# Patient Record
Sex: Female | Born: 1945 | Race: White | Hispanic: No | Marital: Married | Smoking: Former smoker
Health system: Southern US, Community
[De-identification: ages and names within clinical notes are randomized; demographics above are authoritative.]

## PROBLEM LIST (undated history)

## (undated) DIAGNOSIS — K219 Gastro-esophageal reflux disease without esophagitis: Secondary | ICD-10-CM

## (undated) DIAGNOSIS — E7800 Pure hypercholesterolemia, unspecified: Secondary | ICD-10-CM

## (undated) DIAGNOSIS — I10 Essential (primary) hypertension: Secondary | ICD-10-CM

## (undated) HISTORY — PX: ABDOMINAL HYSTERECTOMY: SHX81

## (undated) HISTORY — PX: BREAST SURGERY: SHX581

---

## 2016-11-24 ENCOUNTER — Emergency Department (HOSPITAL_BASED_OUTPATIENT_CLINIC_OR_DEPARTMENT_OTHER): Payer: Medicare Other

## 2016-11-24 ENCOUNTER — Encounter (HOSPITAL_BASED_OUTPATIENT_CLINIC_OR_DEPARTMENT_OTHER): Payer: Self-pay

## 2016-11-24 ENCOUNTER — Emergency Department (HOSPITAL_BASED_OUTPATIENT_CLINIC_OR_DEPARTMENT_OTHER)
Admission: EM | Admit: 2016-11-24 | Discharge: 2016-11-24 | Disposition: A | Discharge status: Home / Self Care | Payer: Medicare Other | Attending: Emergency Medicine | Admitting: Emergency Medicine

## 2016-11-24 DIAGNOSIS — Z8719 Personal history of other diseases of the digestive system: Secondary | ICD-10-CM | POA: Insufficient documentation

## 2016-11-24 DIAGNOSIS — I10 Essential (primary) hypertension: Secondary | ICD-10-CM | POA: Insufficient documentation

## 2016-11-24 DIAGNOSIS — R1031 Right lower quadrant pain: Secondary | ICD-10-CM | POA: Diagnosis present

## 2016-11-24 DIAGNOSIS — Z87891 Personal history of nicotine dependence: Secondary | ICD-10-CM | POA: Diagnosis not present

## 2016-11-24 DIAGNOSIS — Z79899 Other long term (current) drug therapy: Secondary | ICD-10-CM | POA: Insufficient documentation

## 2016-11-24 DIAGNOSIS — R1900 Intra-abdominal and pelvic swelling, mass and lump, unspecified site: Secondary | ICD-10-CM | POA: Insufficient documentation

## 2016-11-24 HISTORY — DX: Gastro-esophageal reflux disease without esophagitis: K21.9

## 2016-11-24 HISTORY — DX: Pure hypercholesterolemia, unspecified: E78.00

## 2016-11-24 HISTORY — DX: Essential (primary) hypertension: I10

## 2016-11-24 LAB — COMPREHENSIVE METABOLIC PANEL
ALT: 18 U/L (ref 14–54)
ANION GAP: 14 (ref 5–15)
AST: 21 U/L (ref 15–41)
Albumin: 4.6 g/dL (ref 3.5–5.0)
Alkaline Phosphatase: 87 U/L (ref 38–126)
BILIRUBIN TOTAL: 1.2 mg/dL (ref 0.3–1.2)
BUN: 9 mg/dL (ref 6–20)
CO2: 20 mmol/L — ABNORMAL LOW (ref 22–32)
Calcium: 9.2 mg/dL (ref 8.9–10.3)
Chloride: 100 mmol/L — ABNORMAL LOW (ref 101–111)
Creatinine, Ser: 1.04 mg/dL — ABNORMAL HIGH (ref 0.44–1.00)
GFR calc Af Amer: >60 mL/min (ref >60)
GFR, EST NON AFRICAN AMERICAN: 54 mL/min — AB (ref >60)
Glucose, Bld: 107 mg/dL — ABNORMAL HIGH (ref 65–99)
POTASSIUM: 3.4 mmol/L — AB (ref 3.5–5.1)
Sodium: 134 mmol/L — ABNORMAL LOW (ref 135–145)
TOTAL PROTEIN: 7.5 g/dL (ref 6.5–8.1)

## 2016-11-24 LAB — CBC WITH DIFFERENTIAL/PLATELET
BASOS ABS: 0 10*3/uL (ref 0.0–0.1)
BASOS PCT: 0 %
EOS PCT: 1 %
Eosinophils Absolute: 0.1 10*3/uL (ref 0.0–0.7)
HCT: 42.9 % (ref 36.0–46.0)
Hemoglobin: 15 g/dL (ref 12.0–15.0)
LYMPHS PCT: 19 %
Lymphs Abs: 1.7 10*3/uL (ref 0.7–4.0)
MCH: 31.8 pg (ref 26.0–34.0)
MCHC: 35 g/dL (ref 30.0–36.0)
MCV: 91.1 fL (ref 78.0–100.0)
MONO ABS: 0.6 10*3/uL (ref 0.1–1.0)
MONOS PCT: 7 %
Neutro Abs: 6.5 10*3/uL (ref 1.7–7.7)
Neutrophils Relative %: 73 %
PLATELETS: 190 10*3/uL (ref 150–400)
RBC: 4.71 MIL/uL (ref 3.87–5.11)
RDW: 13.5 % (ref 11.5–15.5)
WBC: 8.9 10*3/uL (ref 4.0–10.5)

## 2016-11-24 LAB — LIPASE, BLOOD: LIPASE: 31 U/L (ref 11–51)

## 2016-11-24 MED ORDER — SODIUM CHLORIDE 0.9 % IV SOLN
Freq: Once | INTRAVENOUS | Status: AC
  Administered 2016-11-24: 20:00:00 via INTRAVENOUS

## 2016-11-24 MED ORDER — IOPAMIDOL (ISOVUE-300) INJECTION 61%
100.0000 mL | Freq: Once | INTRAVENOUS | Status: AC | PRN
  Administered 2016-11-24: 100 mL via INTRAVENOUS

## 2016-11-24 NOTE — ED Triage Notes (Signed)
C/o RLQ pain x 1 week-denies n/v/d, urinary sx-NAD-steady gait

## 2016-11-24 NOTE — Discharge Instructions (Signed)
Your CT scan shows a Pelvic Mass. CT suggests, but cannot confirm that it is Ovarian based. I suggest GYN Oncology follow up as soon as possible. I suggest contacting [facility] to obtain surgical records about your hysterectomy, and ?ovarian removal. A CA-125 blood test was ordered. This is a blood test that can be used to help diagnose and follow treatment for ovarian tumors.

## 2016-11-24 NOTE — ED Notes (Signed)
Patient transported to CT 

## 2016-11-24 NOTE — ED Provider Notes (Signed)
[facility] Note   CSN: [id number] Arrival date & time: 11/24/16  1942   By signing my name below, I, Freida Busman, attest that this documentation has been prepared under the direction and in the presence of Rolland Porter, MD . Electronically Signed: Freida Busman, Scribe. 11/24/2016. 8:28 PM.   History   Chief Complaint Chief Complaint  Patient presents with  . Abdominal Pain   The history is provided by the patient. No language interpreter was used.    HPI Comments:  Tatia Overdorf is a 71 y.o. female with a history of GERD who presents to the Emergency Department complaining of non-radiating intermittent RLQ x ~1 month, worse today. Her pain is exacerbated with movement.  Pain is improved when she is at rest. No fever or nausea. No h/o abdominal surgeries.   Past Medical History:  Diagnosis Date  . GERD (gastroesophageal reflux disease)   . High cholesterol   . Hypertension     There are no active problems to display for this patient.   Past Surgical History:  Procedure Laterality Date  . ABDOMINAL HYSTERECTOMY    . BREAST SURGERY      OB History    No data available       Home Medications    Prior to Admission medications   Medication Sig Start Date End Date Taking? Authorizing Provider  amLODipine (NORVASC) 5 MG tablet Take 5 mg by mouth daily.   Yes [provider]  atorvastatin (LIPITOR) 10 MG tablet Take 10 mg by mouth daily.   Yes [provider]  Est Estrogens-Methyltest (ESTRATEST PO) Take by mouth.   Yes [provider]  hydrochlorothiazide (MICROZIDE) 12.5 MG capsule Take 12.5 mg by mouth daily.   Yes [provider]  omeprazole (PRILOSEC) 20 MG capsule Take 20 mg by mouth daily.   Yes [provider]    Family History No family history on file.  Social History Social History  Substance Use Topics  . Smoking status: Former Games developer  . Smokeless tobacco: Never Used  . Alcohol use  Yes     Comment: daily     Allergies   Percocet [oxycodone-acetaminophen]   Review of Systems Review of Systems  Constitutional: Negative for appetite change, chills, diaphoresis, fatigue and fever.  HENT: Negative for mouth sores, sore throat and trouble swallowing.   Eyes: Negative for visual disturbance.  Respiratory: Negative for cough, chest tightness, shortness of breath and wheezing.   Cardiovascular: Negative for chest pain.  Gastrointestinal: Positive for abdominal pain. Negative for abdominal distention, diarrhea, nausea and vomiting.  Endocrine: Negative for polydipsia, polyphagia and polyuria.  Genitourinary: Negative for dysuria, frequency and hematuria.  Musculoskeletal: Negative for gait problem.  Skin: Negative for color change, pallor and rash.  Neurological: Negative for dizziness, syncope, light-headedness and headaches.  Hematological: Does not bruise/bleed easily.  Psychiatric/Behavioral: Negative for behavioral problems and confusion.     Physical Exam Updated Vital Signs BP 102/83 (BP Location: Left Arm)   Pulse (!) 108   Temp 98.5 F (36.9 C) (Oral)   Resp 18   Ht 5\' 2"  (1.575 m)   Wt 81.2 kg (179 lb)   SpO2 100%   BMI 32.74 kg/m   Physical Exam  Constitutional: She is oriented to person, place, and time. She appears well-developed and well-nourished. No distress.  HENT:  Head: Normocephalic.  Eyes: Conjunctivae are normal. Pupils are equal, round, and reactive to light. No scleral icterus.  Neck: Normal range of  motion. Neck supple. No thyromegaly present.  Cardiovascular: Normal rate and regular rhythm.  Exam reveals no gallop and no friction rub.   No murmur heard. HR 109 at bedside.  Pulmonary/Chest: Effort normal and breath sounds normal. No respiratory distress. She has no wheezes. She has no rales.  Abdominal: Soft. Bowel sounds are normal. She exhibits no distension. There is tenderness (slight ). There is rebound (slight).    Musculoskeletal: Normal range of motion.  Neurological: She is alert and oriented to person, place, and time.  Skin: Skin is warm and dry. No rash noted.  Psychiatric: She has a normal mood and affect. Her behavior is normal.  Nursing note and vitals reviewed.    ED Treatments / Results  DIAGNOSTIC STUDIES:  Oxygen Saturation is 100% on RA, normal by my interpretation.    COORDINATION OF CARE:  8:08 PM Discussed treatment plan with pt at bedside and pt agreed to plan.  Labs (all labs ordered are listed, but only abnormal results are displayed) Labs Reviewed  COMPREHENSIVE METABOLIC PANEL - Abnormal; Notable for the following:       Result Value   Sodium 134 (*)    Potassium 3.4 (*)    Chloride 100 (*)    CO2 20 (*)    Glucose, Bld 107 (*)    Creatinine, Ser 1.04 (*)    GFR calc non Af Amer 54 (*)    All other components within normal limits  CBC WITH DIFFERENTIAL/PLATELET  LIPASE, BLOOD  URINALYSIS, ROUTINE W REFLEX MICROSCOPIC  CA 125    EKG  EKG Interpretation None       Radiology Ct Abdomen Pelvis W Contrast  Result Date: 11/24/2016 CLINICAL DATA:  Right lower quadrant pain for 1 week EXAM: CT ABDOMEN AND PELVIS WITH CONTRAST TECHNIQUE: Multidetector CT imaging of the abdomen and pelvis was performed using the standard protocol following bolus administration of intravenous contrast. CONTRAST:  ISOVUE-300 IOPAMIDOL (ISOVUE-300) INJECTION 61% COMPARISON:  None. FINDINGS: Lower chest: Partially visualized bilateral breast prostheses. Small focus of ground-glass density in the anterior right lung base may reflect a small focus of inflammation. No pleural effusion. Normal heart size. Hepatobiliary: No focal liver abnormality is seen. No gallstones, gallbladder wall thickening, or biliary dilatation. Pancreas: Unremarkable. No pancreatic ductal dilatation or surrounding inflammatory changes. Spleen: Normal in size without focal abnormality. Adrenals/Urinary Tract:  Adrenal glands are unremarkable. Kidneys are normal, without renal calculi, focal lesion, or hydronephrosis. Bladder is unremarkable. Stomach/Bowel: Stomach is within normal limits. Appendix appears normal. No evidence of bowel wall thickening, distention, or inflammatory changes. Vascular/Lymphatic: Aortic atherosclerosis. No enlarged abdominal or pelvic lymph nodes. Reproductive: Status post hysterectomy. Large complex cystic and solid mass with multiple thick septations, emanating from the pelvis and extending into the mid abdomen, this measures approximately 20 cm transverse by 12.3 cm AP by 16 cm craniocaudad. On coronal views, this may be contiguous with the left adnexa. Other: Small free fluid in the pelvis.  No free air. Musculoskeletal: Degenerative changes. Grade 1 anterolisthesis of L4 on L5. No suspicious bone lesion IMPRESSION: 1. Large complex cystic and solid mass with multiple thick septations emanating from the pelvis and extending into the mid abdomen, measuring approximately 20 cm in maximum diameter. 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 Gynecologic consultation recommended. 2. Normal appendix 3. Small focus of ground-glass density in the right lung base may reflect a small focus of infection or inflammation. Electronically  Signed   By: Jasmine PangKim  Fujinaga M.D.   On: 11/24/2016 21:21    Procedures Procedures (including critical care time)  Medications Ordered in ED Medications  0.9 %  sodium chloride infusion ( Intravenous New Bag/Given 11/24/16 2025)  iopamidol (ISOVUE-300) 61 % injection 100 mL (100 mLs Intravenous Contrast Given 11/24/16 2052)     Initial Impression / Assessment and Plan / ED Course  I have reviewed the triage vital signs and the nursing notes.  Pertinent labs & imaging results that were available during my care of the patient were reviewed by me and considered in my medical decision making  (see chart for details).   On recheck, HR 91  CT scan unfortunately shows a large pelvic mass. Per radiology it appears cystic, possibly left adnexal base which would suggest ovarian neoplasm. CA-125 has been ordered. I discussed this at length with the patient, and her husband. Her husband is a retired Administrator, [place] He states that he will make appropriate contact with GYN in referrals tomorrow morning. I offered to initiate this process tonight and they politely declined.  Final Clinical Impressions(s) / ED Diagnoses   Final diagnoses:  Pelvic mass    New Prescriptions New Prescriptions   No medications on file   I personally performed the services described in this documentation, which was scribed in my presence. The recorded information has been reviewed and is accurate.     Rolland PorterJames, Tanairy Payeur, MD 11/24/16 2151

## 2016-11-26 LAB — CA 125: CA 125: 42.4 U/mL — ABNORMAL HIGH (ref 0.0–38.1)

## 2018-12-26 IMAGING — CT CT ABD-PELV W/ CM
2 of 5 series · 16 of 46 positions shown, 18 images · IV contrast (APPLIED)
Comparison: None.

CLINICAL DATA: Right lower quadrant pain for 1 week

EXAM:
CT ABDOMEN AND PELVIS WITH CONTRAST
TECHNIQUE: Multidetector CT imaging of the abdomen and pelvis was performed
using the standard protocol following bolus administration of
intravenous contrast.
CONTRAST:  100mL X3QYZF-200 IOPAMIDOL (X3QYZF-200) INJECTION 61%

[Series 2: axial st · axial · 0.88mm/px · z∈[-441,-46]mm · 13 of 91 slices shown, 15 images]
[im 6/91  soft-tissue]
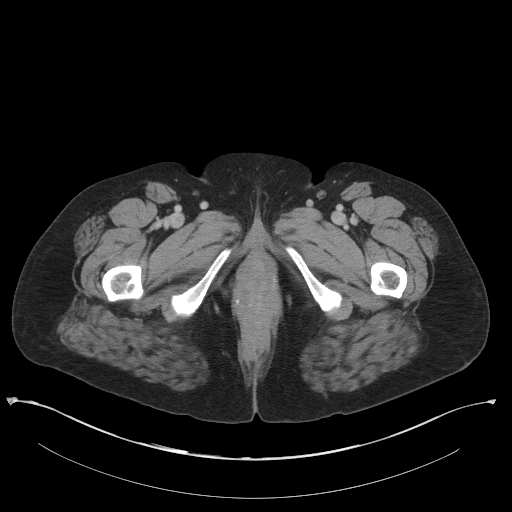
[im 6/91  bone]
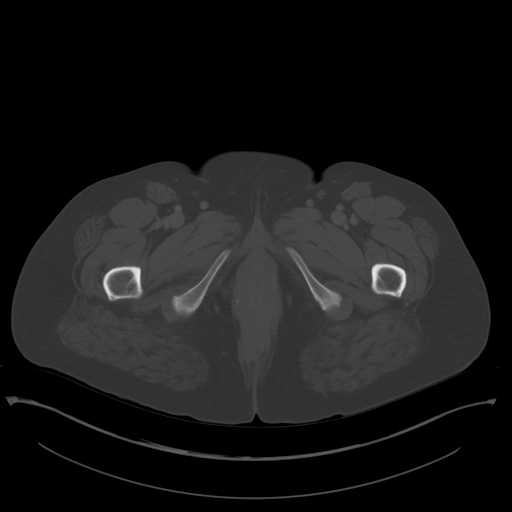
[im 12/91  soft-tissue]
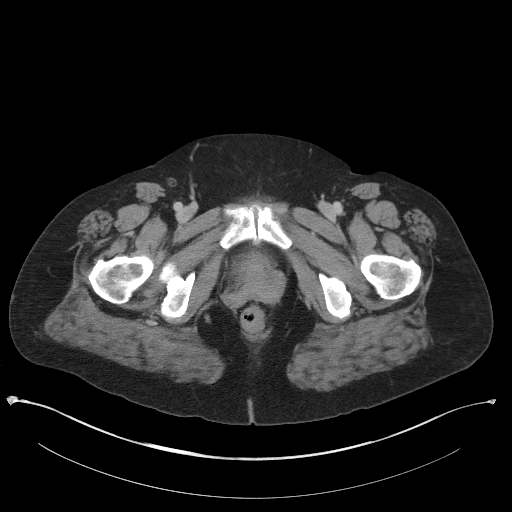
[im 17/91  soft-tissue]
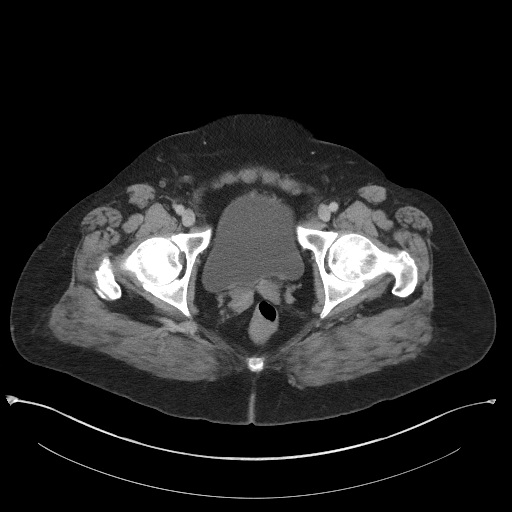
[im 29/91  soft-tissue]
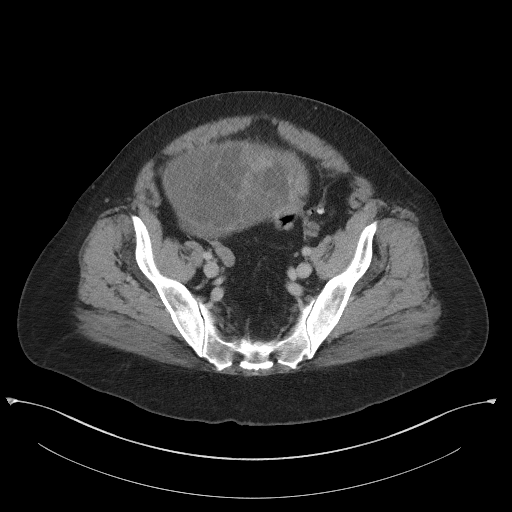
[im 34/91  soft-tissue]
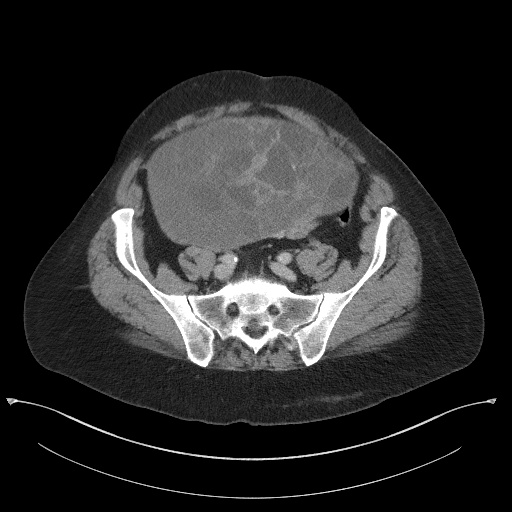
[im 40/91  soft-tissue]
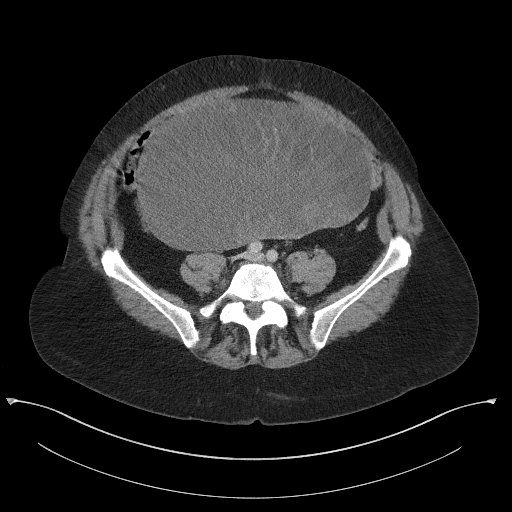
[im 46/91  soft-tissue]
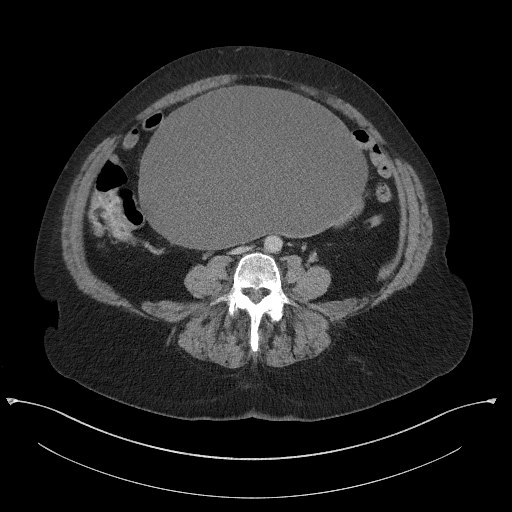
[im 51/91  soft-tissue]
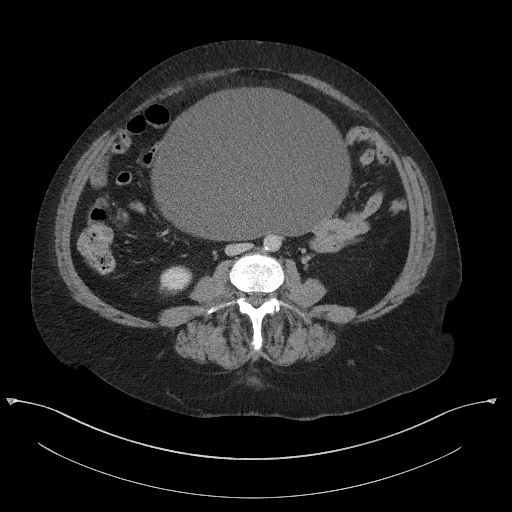
[im 57/91  soft-tissue]
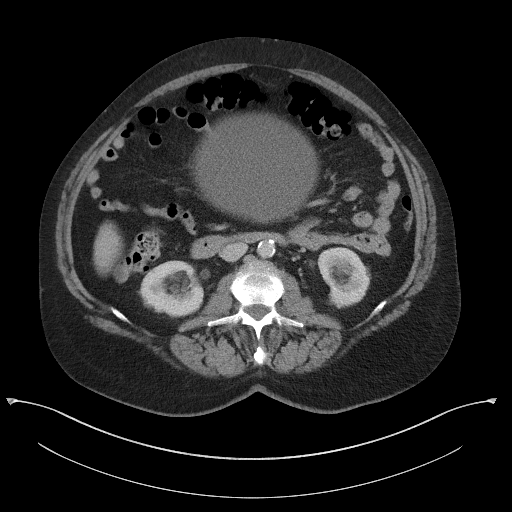
[im 57/91  bone]
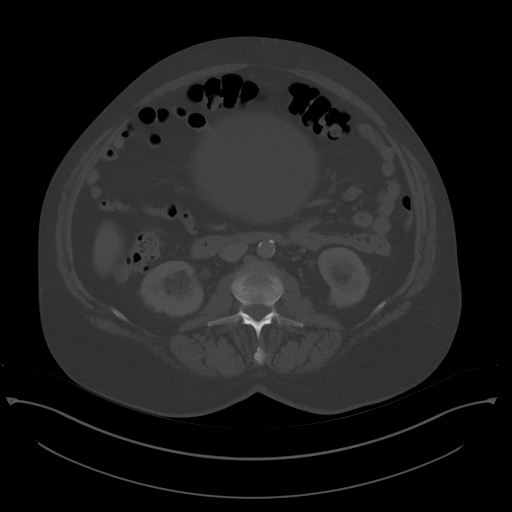
[im 62/91  soft-tissue]
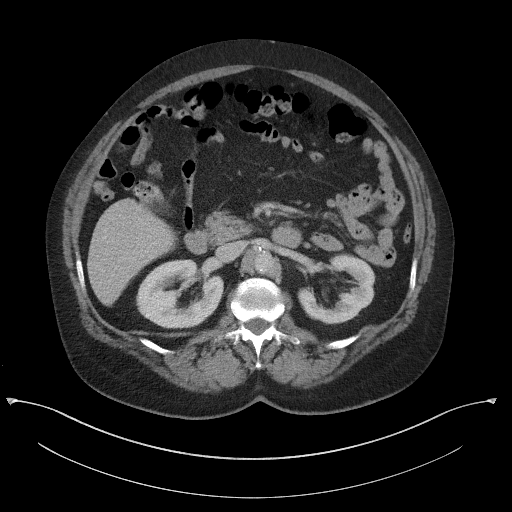
[im 74/91  soft-tissue]
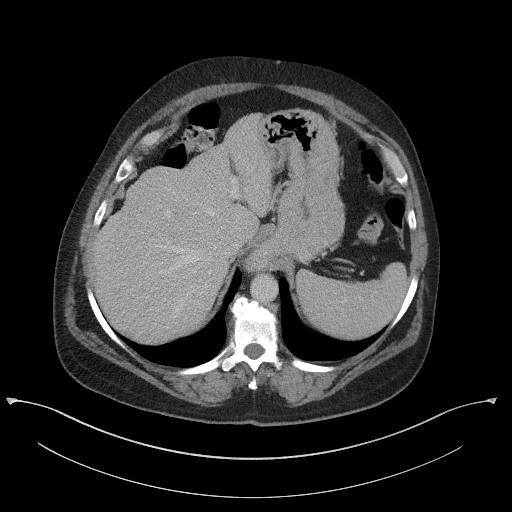
[im 79/91  soft-tissue]
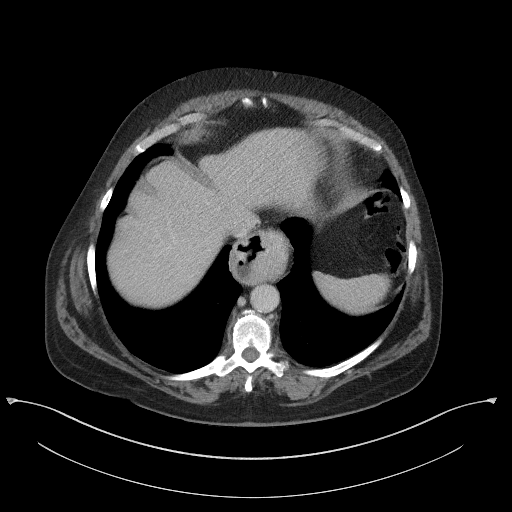
[im 85/91  soft-tissue]
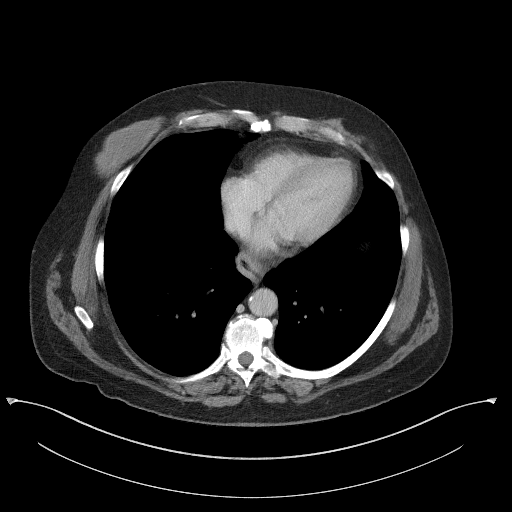

[Series 5: coronal st · coronal · 0.83mm/px · 3 of 110 slices shown]
[im 37/110  soft-tissue]
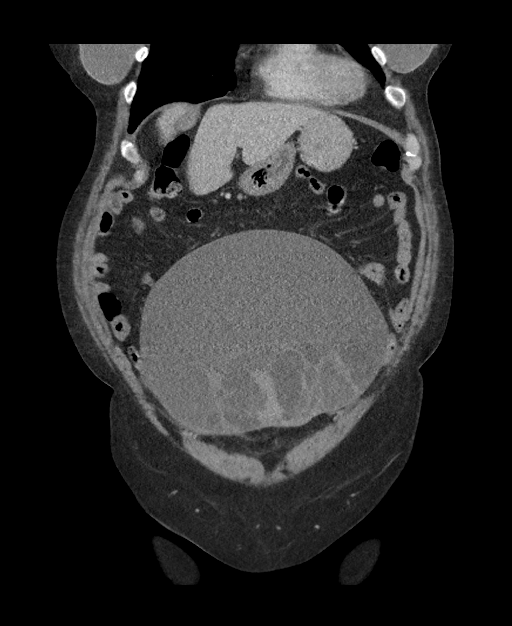
[im 49/110  soft-tissue]
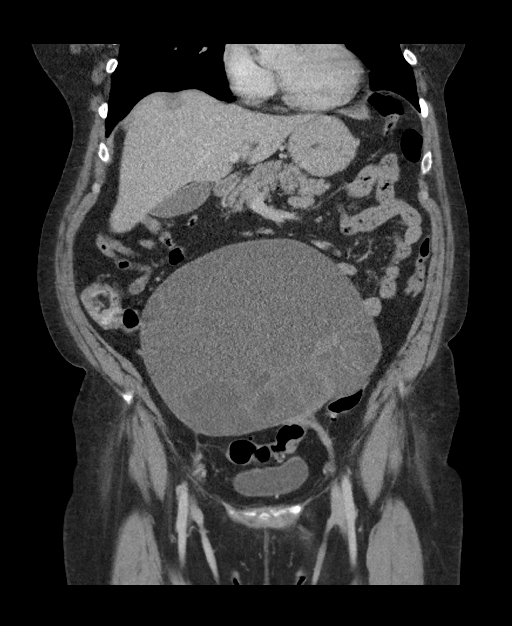
[im 61/110  soft-tissue]
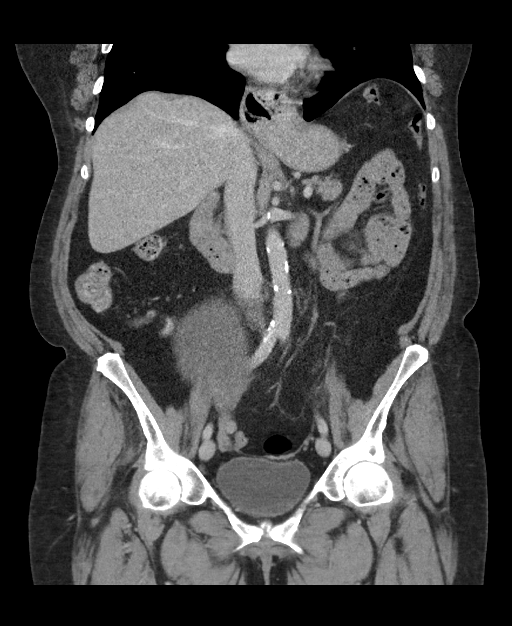

[16 of 46 positions shown; findings below may reference images not displayed]

FINDINGS: Lower chest: Partially visualized bilateral breast prostheses. Small
focus of ground-glass density in the anterior right lung base may
reflect a small focus of inflammation. No pleural effusion. Normal
heart size.

Hepatobiliary: No focal liver abnormality is seen. No gallstones,
gallbladder wall thickening, or biliary dilatation.

Pancreas: Unremarkable. No pancreatic ductal dilatation or
surrounding inflammatory changes.

Spleen: Normal in size without focal abnormality.

Adrenals/Urinary Tract: Adrenal glands are unremarkable. Kidneys are
normal, without renal calculi, focal lesion, or hydronephrosis.
Bladder is unremarkable.

Stomach/Bowel: Stomach is within normal limits. Appendix appears
normal. No evidence of bowel wall thickening, distention, or
inflammatory changes.

Vascular/Lymphatic: Aortic atherosclerosis. No enlarged abdominal or
pelvic lymph nodes.

Reproductive: Status post hysterectomy. Large complex cystic and
solid mass with multiple thick septations, emanating from the pelvis
and extending into the mid abdomen, this measures approximately 20
cm transverse by 12.3 cm AP by 16 cm craniocaudad. On coronal views,
this may be contiguous with the left adnexa.

Other: Small free fluid in the pelvis.  No free air.

Musculoskeletal: Degenerative changes. Grade 1 anterolisthesis of L4
on L5. No suspicious bone lesion
IMPRESSION: 1. Large complex cystic and solid mass with multiple thick
septations emanating from the pelvis and extending into the mid
abdomen, measuring approximately 20 cm in maximum diameter. Findings
are concerning for ovarian neoplasm, coronal views suggest that the
lesion may be arising from left adnexa. Gynecologic consultation
recommended.
2. Normal appendix
3. Small focus of ground-glass density in the right lung base may
reflect a small focus of infection or inflammation.

## 2019-07-04 ENCOUNTER — Emergency Department (HOSPITAL_BASED_OUTPATIENT_CLINIC_OR_DEPARTMENT_OTHER): Payer: Medicare Other

## 2019-07-04 ENCOUNTER — Encounter (HOSPITAL_BASED_OUTPATIENT_CLINIC_OR_DEPARTMENT_OTHER): Payer: Self-pay | Admitting: *Deleted

## 2019-07-04 ENCOUNTER — Other Ambulatory Visit: Payer: Self-pay

## 2019-07-04 ENCOUNTER — Emergency Department (HOSPITAL_BASED_OUTPATIENT_CLINIC_OR_DEPARTMENT_OTHER)
Admission: EM | Admit: 2019-07-04 | Discharge: 2019-07-04 | Disposition: A | Discharge status: Home / Self Care | Payer: Medicare Other | Attending: Emergency Medicine | Admitting: Emergency Medicine

## 2019-07-04 DIAGNOSIS — Y9389 Activity, other specified: Secondary | ICD-10-CM | POA: Diagnosis not present

## 2019-07-04 DIAGNOSIS — Y929 Unspecified place or not applicable: Secondary | ICD-10-CM | POA: Insufficient documentation

## 2019-07-04 DIAGNOSIS — Z23 Encounter for immunization: Secondary | ICD-10-CM | POA: Insufficient documentation

## 2019-07-04 DIAGNOSIS — I10 Essential (primary) hypertension: Secondary | ICD-10-CM | POA: Diagnosis not present

## 2019-07-04 DIAGNOSIS — Z87891 Personal history of nicotine dependence: Secondary | ICD-10-CM | POA: Insufficient documentation

## 2019-07-04 DIAGNOSIS — S61412A Laceration without foreign body of left hand, initial encounter: Secondary | ICD-10-CM

## 2019-07-04 DIAGNOSIS — W260XXA Contact with knife, initial encounter: Secondary | ICD-10-CM | POA: Diagnosis not present

## 2019-07-04 DIAGNOSIS — E782 Mixed hyperlipidemia: Secondary | ICD-10-CM | POA: Insufficient documentation

## 2019-07-04 DIAGNOSIS — Y999 Unspecified external cause status: Secondary | ICD-10-CM | POA: Insufficient documentation

## 2019-07-04 DIAGNOSIS — Z885 Allergy status to narcotic agent status: Secondary | ICD-10-CM | POA: Diagnosis not present

## 2019-07-04 DIAGNOSIS — Z79899 Other long term (current) drug therapy: Secondary | ICD-10-CM | POA: Insufficient documentation

## 2019-07-04 MED ORDER — TETANUS-DIPHTH-ACELL PERTUSSIS 5-2.5-18.5 LF-MCG/0.5 IM SUSP
0.5000 mL | Freq: Once | INTRAMUSCULAR | Status: AC
  Administered 2019-07-04: 19:00:00 0.5 mL via INTRAMUSCULAR
  Filled 2019-07-04: qty 0.5

## 2019-07-04 MED ORDER — BACITRACIN ZINC 500 UNIT/GM EX OINT
TOPICAL_OINTMENT | Freq: Once | CUTANEOUS | Status: AC
  Filled 2019-07-04: qty 28.35

## 2019-07-04 NOTE — ED Triage Notes (Signed)
c/o lac to left hand by kitchen knife x 30 mins ago

## 2019-07-04 NOTE — Discharge Instructions (Signed)
Keep the wound clean and dry for the first 24 hours. After that you may gently clean the wound with soap and water. Make sure to pat dry the wound before covering it with any dressing. You can use topical antibiotic ointment and bandage. Ice and elevate for pain relief.   You can take Tylenol or Ibuprofen as directed for pain. You can alternate Tylenol and Ibuprofen every 4 hours for additional pain relief.   If you start having numbness, tingling in your fingers, I provided you with outpatient referral to hand surgeon who can follow-up with.  Return to the Emergency Department, your primary care doctor, or the [facility] in 5-7 days for suture removal.   Monitor closely for any signs of infection. Return to the Emergency Department for any worsening redness/swelling of the area that begins to spread, drainage from the site, worsening pain, fever or any other worsening or concerning symptoms.

## 2019-07-04 NOTE — ED Provider Notes (Signed)
[facility] EMERGENCY DEPARTMENT Provider Note   CSN: [id number] Arrival date & time: 07/04/19  1816     History Chief Complaint  Patient presents with  . Extremity Laceration    Kathy Butler is a 74 y.o. female possible history of GERD, high cholesterol, hypertension who presents for evaluation of left hand laceration that occurred 30 minutes prior to ED arrival.  Patient reports that she was attempting to open up a bottle and was using a knife when the knife slipped and punctured her left hand in between the thumb and index finger.  She is not currently on blood thinners.  She states she can move it without any difficulty.  She denies any numbness/weakness.  She does not know when her last tetanus shot was.  The history is provided by the patient.       Past Medical History:  Diagnosis Date  . GERD (gastroesophageal reflux disease)   . High cholesterol   . Hypertension     There are no problems to display for this patient.   Past Surgical History:  Procedure Laterality Date  . ABDOMINAL HYSTERECTOMY    . BREAST SURGERY       OB History   No obstetric history on file.     History reviewed. No pertinent family history.  Social History   Tobacco Use  . Smoking status: Former Games developer  . Smokeless tobacco: Never Used  Substance Use Topics  . Alcohol use: Yes    Comment: daily  . Drug use: No    Home Medications Prior to Admission medications   Medication Sig Start Date End Date Taking? Authorizing Provider  amLODipine (NORVASC) 5 MG tablet Take 5 mg by mouth daily.    [provider]  atorvastatin (LIPITOR) 10 MG tablet Take 10 mg by mouth daily.    [provider]  Est Estrogens-Methyltest (ESTRATEST PO) Take by mouth.    [provider]  hydrochlorothiazide (MICROZIDE) 12.5 MG capsule Take 12.5 mg by mouth daily.    [provider]  omeprazole (PRILOSEC) 20 MG capsule Take 20 mg by mouth daily.    [provider]    Allergies    Percocet [oxycodone-acetaminophen]  Review of Systems   Review of Systems  Skin: Positive for wound.  Neurological: Negative for weakness and numbness.  All other systems reviewed and are negative.   Physical Exam Updated Vital Signs BP (!) 164/75 (BP Location: Right Arm)   Pulse 68   Temp 98.8 F (37.1 C) (Oral)   Resp 16   Ht 5\' 2"  (1.575 m)   Wt 77.1 kg   SpO2 100%   BMI 31.09 kg/m   Physical Exam Vitals and nursing note reviewed.  Constitutional:      Appearance: She is well-developed.  HENT:     Head: Normocephalic and atraumatic.  Eyes:     General: No scleral icterus.       Right eye: No discharge.        Left eye: No discharge.     Conjunctiva/sclera: Conjunctivae normal.  Cardiovascular:     Pulses:          Radial pulses are 2+ on the right side and 2+ on the left side.  Pulmonary:     Effort: Pulmonary effort is normal.  Musculoskeletal:     Comments: Full range of motion of left hand without any difficulty.  She can fully flex and extend all 5 digits without any difficulty.  She  can make a fist without any difficulty.  Skin:    General: Skin is warm and dry.     Capillary Refill: Capillary refill takes less than 2 seconds.     Comments: Good distal cap refill.  LUE is not dusky in appearance or cool to touch.  1.5 cm wound noted to the webspace between the index and thumb.  Neurological:     Mental Status: She is alert.     Comments: Sensation intact along major nerve distributions of BUE  Psychiatric:        Speech: Speech normal.        Behavior: Behavior normal.     ED Results / Procedures / Treatments   Labs (all labs ordered are listed, but only abnormal results are displayed) Labs Reviewed - No data to display  EKG None  Radiology DG Hand Complete Left  Result Date: 07/04/2019 CLINICAL DATA:  Laceration kitchen EXAM: LEFT HAND - COMPLETE 3+ VIEW COMPARISON:  None. FINDINGS: No fracture or dislocation.  No radiopaque foreign body. Soft tissue swelling seen over the dorsum of the hand. 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 IMPRESSION: No acute osseous abnormality or radiopaque foreign body. Electronically Signed   By: Jonna Clark M.D.   On: 07/04/2019 19:25    Procedures Procedures (including critical care time)  Medications Ordered in ED Medications  Tdap (BOOSTRIX) injection 0.5 mL (0.5 mLs Intramuscular Given 07/04/19 1850)  bacitracin ointment ( Topical Given 07/04/19 1952)    ED Course  I have reviewed the triage vital signs and the nursing notes.  Pertinent labs & imaging results that were available during my care of the patient were reviewed by me and considered in my medical decision making (see chart for details).    MDM Rules/Calculators/A&P                       74 year old female who presents for evaluation of laceration to left hand that occurred just 30 minutes prior to ED arrival.  She does not know when her last tetanus shot was.  She is not on blood thinners.  On initial arrival, she is afebrile, nontoxic-appearing.  She is hypertensive.  She does report history of high blood pressure.  She states she gets nervous whenever she is with doctors.  She is not currently complaining of any other symptoms.  On exam, she is 1.5 cm wound noted to the webspace between the left thumb and index finger.  She has full range of motion without any difficulty.  She is neurovascularly intact.  Plan to check x-ray to ensure that there is no acute bony abnormality.  Plan for wound care, update tetanus.  X-ray reviewed.  Negative for any acute bony abnormality.  Laceration repaired as documented below..  Patient tolerated procedure well.  Encouraged at home supportive care measures. At this time, patient exhibits no emergent life-threatening condition that require further evaluation in ED or admission. Patient had ample opportunity for  questions and discussion. All patient's questions were answered with full understanding. Strict return precautions discussed. Patient expresses understanding and agreement to plan.   Portions of this note were generated with Scientist, clinical (histocompatibility and immunogenetics). Dictation errors may occur despite best attempts at proofreading.  Final Clinical Impression(s) / ED Diagnoses Final diagnoses:  Laceration of left hand, foreign body presence unspecified, initial encounter    Rx / DC Orders ED Discharge Orders    None  Volanda Napoleon, PA-C 07/04/19 2155    Lucrezia Starch, MD 07/06/19 [phone]

## 2021-08-04 IMAGING — DX DG HAND COMPLETE 3+V*L*
3 series · 3 of 3 positions shown · non-contrast
Comparison: None.

CLINICAL DATA: Laceration kitchen

EXAM:
LEFT HAND - COMPLETE 3+ VIEW

[hand pa]
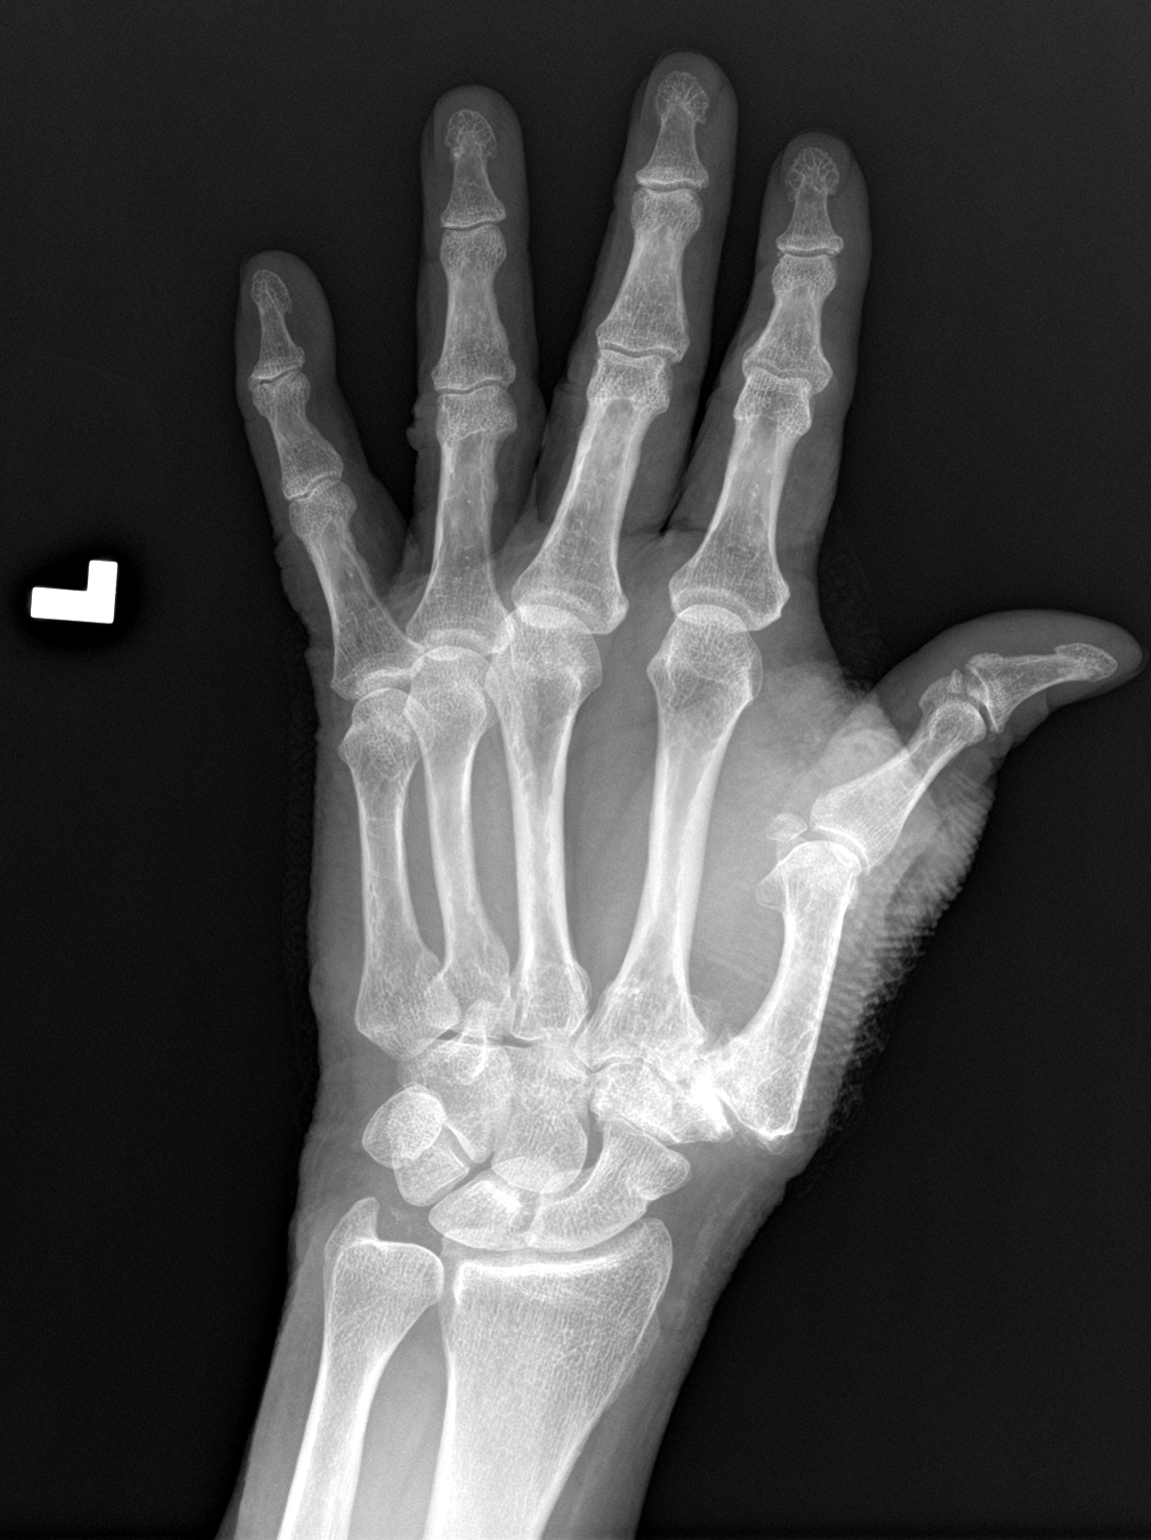

[hand lat]
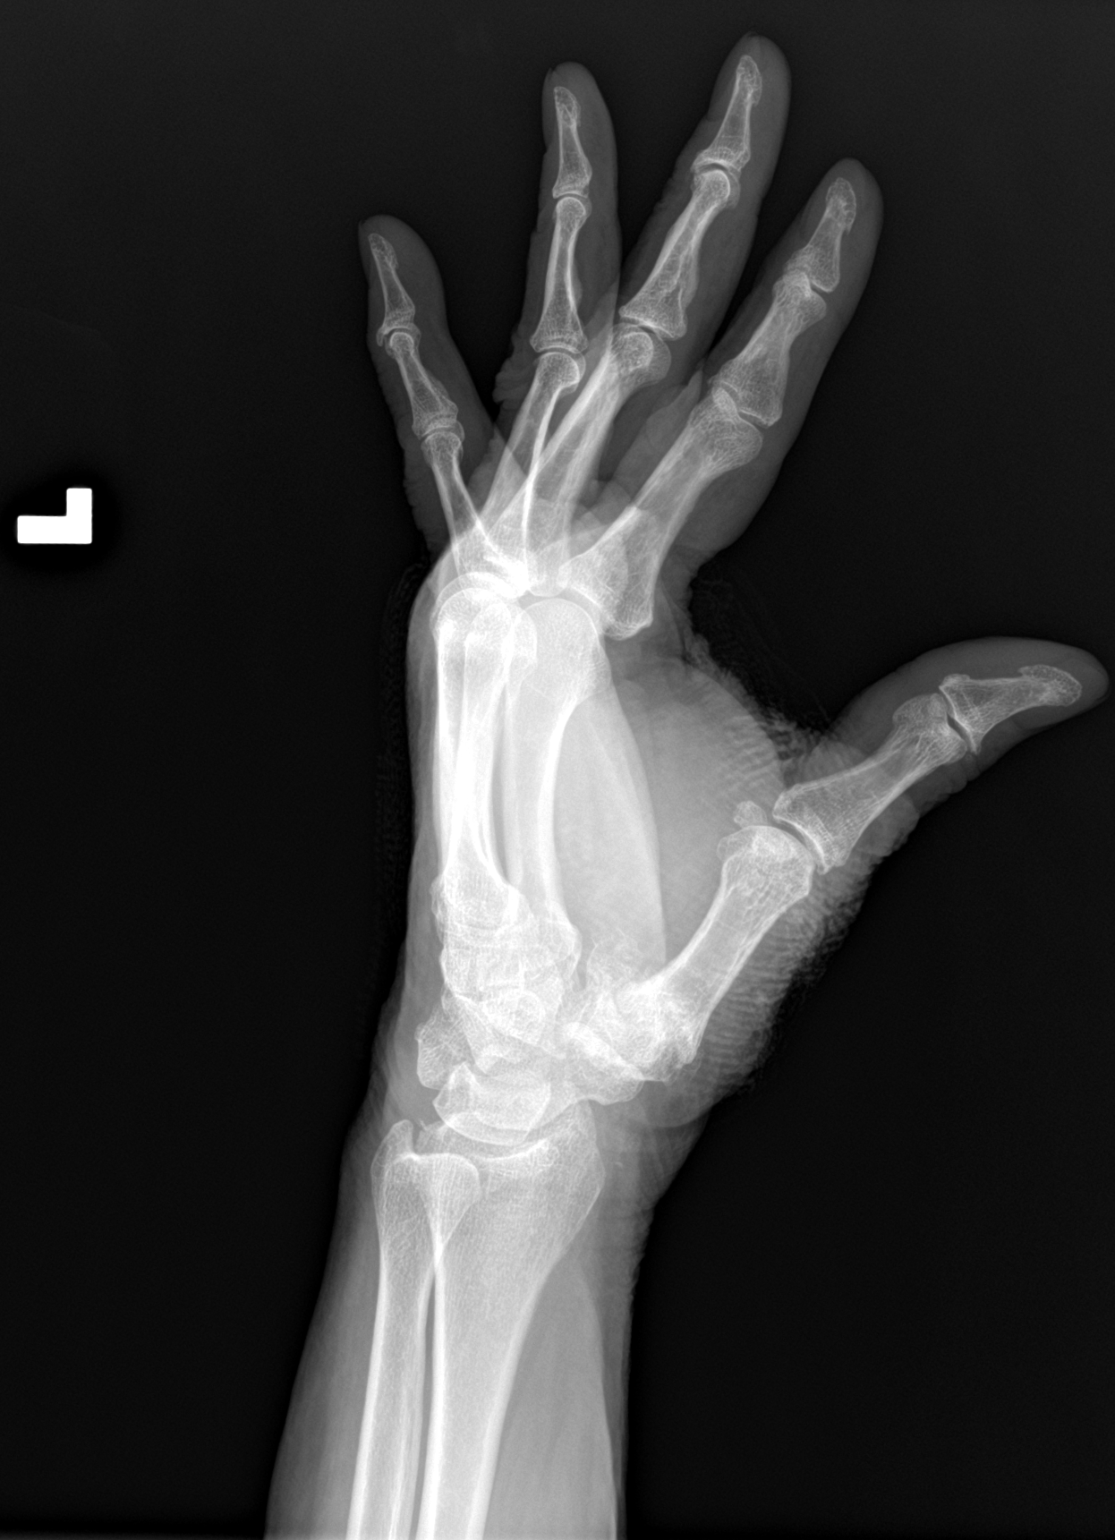

[hand obl]
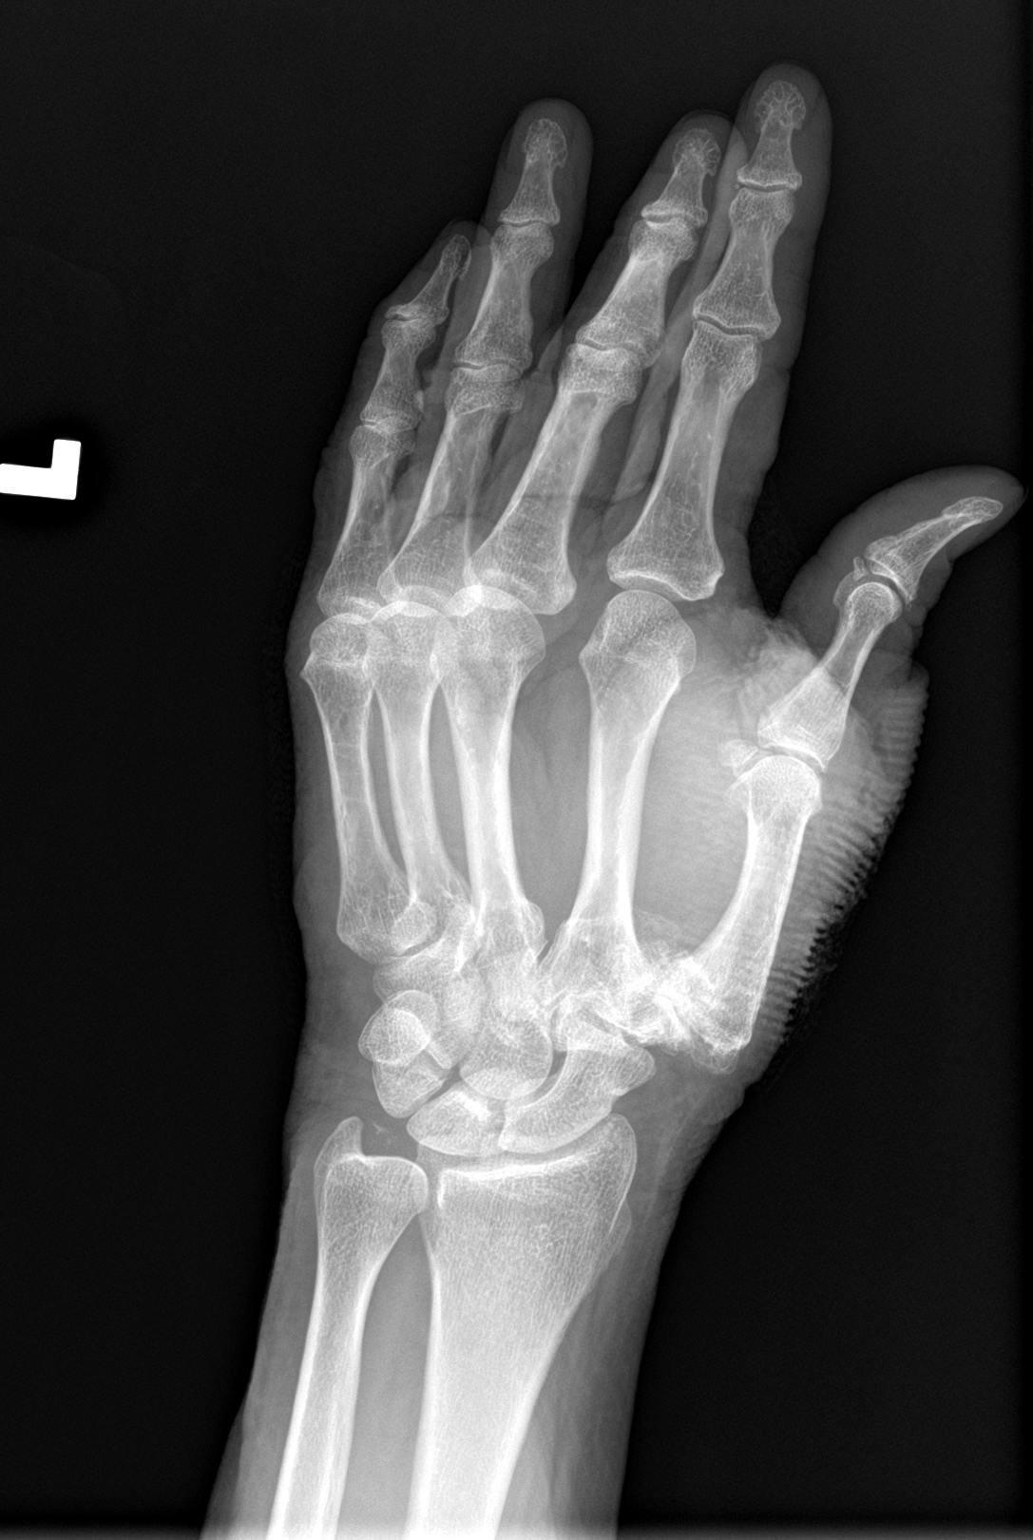

[3 of 3 positions shown; findings below may reference images not displayed]

FINDINGS: No fracture or dislocation. No radiopaque foreign body. Soft tissue
swelling seen over the dorsum of the hand. First CMC joint
osteoarthritis is seen with joint space loss and slight radial
subluxation.
IMPRESSION: No acute osseous abnormality or radiopaque foreign body.
